# Patient Record
Sex: Female | Born: 1960 | Race: Black or African American | Hispanic: No | Smoking: Never smoker
Health system: Southern US, Community
[De-identification: ages and names within clinical notes are randomized; demographics above are authoritative.]

## PROBLEM LIST (undated history)

## (undated) ENCOUNTER — Emergency Department: Discharge status: Absent without leave | Payer: Medicaid Other

## (undated) DIAGNOSIS — E119 Type 2 diabetes mellitus without complications: Secondary | ICD-10-CM

---

## 2015-07-11 ENCOUNTER — Encounter (HOSPITAL_COMMUNITY): Payer: Self-pay | Admitting: Internal Medicine

## 2015-07-11 ENCOUNTER — Observation Stay (HOSPITAL_COMMUNITY)
Admission: AD | Admit: 2015-07-11 | Discharge: 2015-07-13 | Disposition: A | Discharge status: Home / Self Care | Payer: Medicaid Other | Source: Other Acute Inpatient Hospital | Attending: Internal Medicine | Admitting: Internal Medicine

## 2015-07-11 DIAGNOSIS — R51 Headache: Secondary | ICD-10-CM | POA: Diagnosis not present

## 2015-07-11 DIAGNOSIS — R0602 Shortness of breath: Secondary | ICD-10-CM | POA: Insufficient documentation

## 2015-07-11 DIAGNOSIS — E119 Type 2 diabetes mellitus without complications: Secondary | ICD-10-CM | POA: Diagnosis not present

## 2015-07-11 DIAGNOSIS — R001 Bradycardia, unspecified: Secondary | ICD-10-CM | POA: Diagnosis not present

## 2015-07-11 DIAGNOSIS — R519 Headache, unspecified: Secondary | ICD-10-CM | POA: Diagnosis present

## 2015-07-11 DIAGNOSIS — R262 Difficulty in walking, not elsewhere classified: Secondary | ICD-10-CM | POA: Diagnosis not present

## 2015-07-11 DIAGNOSIS — R079 Chest pain, unspecified: Secondary | ICD-10-CM | POA: Diagnosis not present

## 2015-07-11 HISTORY — DX: Type 2 diabetes mellitus without complications: E11.9

## 2015-07-11 LAB — GLUCOSE, CAPILLARY: Glucose-Capillary: 183 mg/dL — ABNORMAL HIGH (ref 65–99)

## 2015-07-11 MED ORDER — SODIUM CHLORIDE 0.9 % IV SOLN
INTRAVENOUS | Status: DC
  Administered 2015-07-12: 02:00:00 via INTRAVENOUS

## 2015-07-11 MED ORDER — SENNOSIDES-DOCUSATE SODIUM 8.6-50 MG PO TABS: 1.0000 | ORAL_TABLET | Freq: Every evening | ORAL | Status: DC | PRN

## 2015-07-11 MED ORDER — INSULIN ASPART 100 UNIT/ML ~~LOC~~ SOLN
0.0000 [IU] | Freq: Three times a day (TID) | SUBCUTANEOUS | Status: DC
  Administered 2015-07-12 (×2): 3 [IU] via SUBCUTANEOUS
  Administered 2015-07-12: 5 [IU] via SUBCUTANEOUS
  Administered 2015-07-13: 1 [IU] via SUBCUTANEOUS
  Administered 2015-07-13: 5 [IU] via SUBCUTANEOUS

## 2015-07-11 NOTE — Progress Notes (Signed)
Pt arrived at 2145 from [facility]. Pt alert and oriented x2. C/O of pain 10/10. Call light within reach. Will continue to monitor.

## 2015-07-11 NOTE — H&P (Addendum)
[facility] History and Physical  Tambra Muller ZOX:[id number] DOB: 10-Dec-1960 DOA: 07/11/2015  Referring physician: Patient was transferred from [facility]. PCP: No primary care provider on file.  Specialists: None.  Chief Complaint: Chest pain and headache.  HPI: Cathy Farley is a 54 y.o. female with history of diabetes mellitus type 2 on Lantus and metformin presented to the ER at [facility] with complaints of chest pain. However that patient also is complaining of generalized body ache and also frontal headache. Patient has been having these symptoms for last 2 days. Denies any fever or chills. Denies any new medications. Patient states she has been feeling weak for last few days and also had a fall one week ago. Patient EKG was showing bad sinus bradycardia with normal troponin. As per the labs reviewed from the other hospital most of them were unremarkable. 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 On exam patient is mildly drowsy and has difficulty walking. Patient also complains of left lower extremity pain. Patient denies any incontinence of urine or bowels. Patient chest pain is mostly in the left anterior chest wall stabbing in nature nonradiating present even at rest. Patient also has mild shortness of breath. Denies any productive cough.   Review of Systems: As presented in the history of presenting illness, rest negative.  Past Medical History  Diagnosis Date  . Diabetes mellitus without complication    History reviewed. No pertinent past surgical history. Social History:  reports that she has never smoked. She does not have any smokeless tobacco history on file. She reports that she does not drink alcohol. Her drug history is not on file. Where does patient  live home. Lives with her father. Can patient participate in ADLs? Yes.  Not on File  Family History:  Family History  Problem Relation Age of Onset  . Diabetes Mellitus II Father       Prior to Admission medications   Not on File    Physical Exam: Filed Vitals:   07/11/15 2145  BP: 114/80  Pulse: 53  Temp: 97.7 F (36.5 C)  TempSrc: Oral  Resp: 20  Height: 5\' 5"  (1.651 m)  Weight: 49.714 kg (109 lb 9.6 oz)  SpO2: 2%     General:  Moderately built and poorly nourished.  Eyes: Anicteric no pallor.  ENT: No discharge from the ears eyes nose and mouth.  Neck: No mass felt.  Cardiovascular: S1 and S2 heard.  Respiratory: No rhonchi or crepitations.  Abdomen: Soft nontender bowel sounds present.  Skin: No rash.  Musculoskeletal: No edema.  Psychiatric: Patient is mildly drowsy.  Neurologic: Mildly drowsy but answering questions appropriately and is oriented to time place and person. Moves all extremities. Patient finds it weak to walk. Perla positive. Tongue is midline.  Labs on Admission:  Basic Metabolic Panel: No results for input(s): NA, K, CL, CO2, GLUCOSE, BUN, CREATININE, CALCIUM, MG, PHOS in the last 168 hours. Liver Function Tests: No results for input(s): AST, ALT, ALKPHOS, BILITOT, PROT, ALBUMIN in the last 168 hours. No results for input(s): LIPASE, AMYLASE in the last 168 hours. No results for input(s): AMMONIA in the last 168 hours. CBC: No results for input(s): WBC, NEUTROABS, HGB, HCT, MCV, PLT in the last 168 hours. Cardiac Enzymes: No results for input(s): CKTOTAL, CKMB, CKMBINDEX, TROPONINI in the last  168 hours.  BNP (last 3 results) No results for input(s): BNP in the last 8760 hours.  ProBNP (last 3 results) No results for input(s): PROBNP in the last 8760 hours.  CBG: No results for input(s): GLUCAP in the last 168 hours.  Radiological Exams on Admission: No results found.  EKG: Independently reviewed. Sinus bradycardia  with a beats around 53 bpm. This was an EKG done at [facility].  Assessment/Plan Principal Problem:   Chest pain Active Problems:   Headache   Diabetes mellitus type 2, controlled   1. Chest pain - has atypical symptoms but given history of diabetes at this time we will cycle cardiac markers check 2-D echo. Check CT angiogram of the chest as patient is also complaining of shortness of breath. 2. Headache - with CAT scan at [facility] showing an last ventricles (I have not seen the official report) I have discussed with on-call neurologist Dr. Amada Jupiter. Dr. Amada Jupiter has advised to get MRI brain and to reconsult neurologist after the MRI brain. Check sedimentation rate. 3. Diabetes mellitus type 2 - patient is on Lantus regimen. Continue with close monitoring of CBGs. Hold metformin for now. 4. Sinus bradycardia - repeat EKG has been ordered. Closely monitor in telemetry. Check TSH. Drug screens were negative at [facility].  I have reviewed patient's charts from [facility]. I have discussed with on-call neurologist Dr. Amada Jupiter. Patient's home medications have yet not been entered in the list. I have requested pharmacy to enter.   DVT Prophylaxis SCDs. Code Status: Full code.  Family Communication: Discussed with patient.  Disposition Plan: Admit to inpatient.    Jordon Bourquin N. [facility] Pager [phone].  If 7PM-7AM, please contact night-coverage [email or web address] Password [facility] 07/11/2015, 11:33 PM

## 2015-07-12 ENCOUNTER — Inpatient Hospital Stay (HOSPITAL_COMMUNITY): Payer: Medicaid Other

## 2015-07-12 ENCOUNTER — Inpatient Hospital Stay (HOSPITAL_BASED_OUTPATIENT_CLINIC_OR_DEPARTMENT_OTHER): Payer: Medicaid Other

## 2015-07-12 ENCOUNTER — Encounter (HOSPITAL_COMMUNITY): Payer: Self-pay | Admitting: Radiology

## 2015-07-12 DIAGNOSIS — R079 Chest pain, unspecified: Secondary | ICD-10-CM

## 2015-07-12 DIAGNOSIS — R51 Headache: Secondary | ICD-10-CM | POA: Diagnosis not present

## 2015-07-12 DIAGNOSIS — R262 Difficulty in walking, not elsewhere classified: Secondary | ICD-10-CM | POA: Diagnosis not present

## 2015-07-12 DIAGNOSIS — E119 Type 2 diabetes mellitus without complications: Secondary | ICD-10-CM | POA: Diagnosis not present

## 2015-07-12 LAB — COMPREHENSIVE METABOLIC PANEL
ALT: 13 U/L — ABNORMAL LOW (ref 14–54)
ANION GAP: 7 (ref 5–15)
AST: 18 U/L (ref 15–41)
Albumin: 3.6 g/dL (ref 3.5–5.0)
Alkaline Phosphatase: 64 U/L (ref 38–126)
BUN: 10 mg/dL (ref 6–20)
CO2: 24 mmol/L (ref 22–32)
CREATININE: 0.59 mg/dL (ref 0.44–1.00)
Calcium: 8.9 mg/dL (ref 8.9–10.3)
Chloride: 105 mmol/L (ref 101–111)
GFR calc Af Amer: >60 mL/min (ref >60)
GFR calc non Af Amer: >60 mL/min (ref >60)
GLUCOSE: 204 mg/dL — AB (ref 65–99)
POTASSIUM: 3.5 mmol/L (ref 3.5–5.1)
Sodium: 136 mmol/L (ref 135–145)
Total Bilirubin: 0.7 mg/dL (ref 0.3–1.2)
Total Protein: 6.6 g/dL (ref 6.5–8.1)

## 2015-07-12 LAB — SEDIMENTATION RATE: Sed Rate: 27 mm/hr — ABNORMAL HIGH (ref 0–22)

## 2015-07-12 LAB — TROPONIN I
Troponin I: <0.03 ng/mL (ref <0.031)
Troponin I: <0.03 ng/mL (ref <0.031)

## 2015-07-12 LAB — GLUCOSE, CAPILLARY
GLUCOSE-CAPILLARY: 491 mg/dL — AB (ref 65–99)
GLUCOSE-CAPILLARY: 245 mg/dL — AB (ref 65–99)
Glucose-Capillary: 208 mg/dL — ABNORMAL HIGH (ref 65–99)
Glucose-Capillary: 375 mg/dL — ABNORMAL HIGH (ref 65–99)

## 2015-07-12 LAB — TSH: TSH: 1.643 u[IU]/mL (ref 0.350–4.500)

## 2015-07-12 LAB — AMMONIA: Ammonia: 25 umol/L (ref 9–35)

## 2015-07-12 MED ORDER — INSULIN GLARGINE 100 UNIT/ML ~~LOC~~ SOLN
20.0000 [IU] | Freq: Every day | SUBCUTANEOUS | Status: DC
  Administered 2015-07-12: 20 [IU] via SUBCUTANEOUS
  Filled 2015-07-12 (×2): qty 0.2

## 2015-07-12 MED ORDER — TRAMADOL HCL 50 MG PO TABS: 50.0000 mg | ORAL_TABLET | Freq: Four times a day (QID) | ORAL | Status: DC | PRN

## 2015-07-12 MED ORDER — ACETAMINOPHEN 325 MG PO TABS
650.0000 mg | ORAL_TABLET | Freq: Four times a day (QID) | ORAL | Status: DC | PRN
  Administered 2015-07-12: 650 mg via ORAL
  Filled 2015-07-12: qty 2

## 2015-07-12 MED ORDER — IOHEXOL 350 MG/ML SOLN
100.0000 mL | Freq: Once | INTRAVENOUS | Status: AC | PRN
  Administered 2015-07-12: 100 mL via INTRAVENOUS

## 2015-07-12 NOTE — Evaluation (Signed)
Physical Therapy Evaluation Patient Details Name: Cathy Farley MRN: [id number] DOB: 08/20/1961 Today's Date: 07/12/2015   History of Present Illness  54 y.o. female with history of diabetes mellitus type 2 on Lantus and metformin presented to the ER at [facility] with complaints of chest pain. However that patient also is complaining of generalized body ache and also frontal headache.  Clinical Impression  Patient demonstrates deficits in functional mobility as indicated below. Will need continued skilled PT to address deficits and maximize function. Will see as indicated and progress as tolerated. At this time, unsure of patient true baseline, patient very anxious and self limiting during session. As patient began to relax and follow cues for gait, patient was able to normalize gait and walk across the room with min guard assist.     Follow Up Recommendations Home health PT;Supervision/Assistance - 24 hour    Equipment Recommendations  Rolling walker with 5" wheels    Recommendations for Other Services       Precautions / Restrictions Precautions Precautions: Fall Restrictions Weight Bearing Restrictions: No      Mobility  Bed Mobility Overal bed mobility: Needs Assistance Bed Mobility: Supine to Sit;Sit to Supine     Supine to sit: Supervision Sit to supine: Min guard   General bed mobility comments: Increased time to perform, MAX cues for sequencing and problem solving to come to EOB, VCs for positioning to elevate LEs back to bed. No physical assist provided  Transfers Overall transfer level: Needs assistance Equipment used: 1 person hand held assist Transfers: Sit to/from Stand Sit to Stand: Min assist         General transfer comment: min assist for stablity and encouragement, patient reports pain in BLEs but could not specify where.   Ambulation/Gait Ambulation/Gait assistance: Min assist Ambulation Distance (Feet): 40 Feet Assistive device: 1 person  hand held assist (with gait belt) Gait Pattern/deviations: Step-to pattern;Step-through pattern;Decreased stride length;Drifts right/left;Narrow base of support Gait velocity: decreased Gait velocity interpretation: <1.8 ft/sec, indicative of risk for recurrent falls General Gait Details: Patient initially reluctant to perform ambulation stating that she could not walk because her legs are "too weak to hold her up". Pointed out to patient that she was already standing patient seem perplexed but then agreeable to ambulate. Patient initally taking very small uncoordiated steps with shuffling gait and no step through advancement. Encouraged patient to take larger stride with manual assist and cues to faciliatate. Patient then able to ambulate with increase stride and ease of gait.   Stairs            Wheelchair Mobility    Modified Rankin (Stroke Patients Only)       Balance Overall balance assessment: Needs assistance   Sitting balance-Leahy Scale: Good     Standing balance support: Single extremity supported;During functional activity Standing balance-Leahy Scale: Poor Standing balance comment: patient able to stand but becomes very anxious and requires increased assist and cues for calming                             Pertinent Vitals/Pain Pain Assessment: reports pain, no value, no facial expression of pain, patient generalizes pain to bilateral LEs but can not precisely state where.    Home Living Family/patient expects to be discharged to:: Private residence Living Arrangements: Children Available Help at Discharge: Available 24 hours/day Type of Home: Apartment Home Access: Level entry     Home Layout: One level  Home Equipment: None Additional Comments: question patient reliabiliaty as historian, some inconsistencies with PLOF     Prior Function Level of Independence: Independent         Comments: question patient reliabiliaty as historian, some  inconsistencies with PLOF      Hand Dominance   Dominant Hand: Right    Extremity/Trunk Assessment   Upper Extremity Assessment: Generalized weakness           Lower Extremity Assessment: Generalized weakness (symetrical strength, reports some neuropathy in BLEs)      Cervical / Trunk Assessment: Kyphotic  Communication      Cognition Arousal/Alertness: Awake/alert Behavior During Therapy: Anxious Overall Cognitive Status: Impaired/Different from baseline Area of Impairment: Attention;Memory;Following commands;Safety/judgement;Awareness;Problem solving     Memory: Decreased short-term memory Following Commands: Follows one step commands consistently;Follows one step commands with increased time Safety/Judgement: Decreased awareness of safety;Decreased awareness of deficits Awareness: Intellectual Problem Solving: Slow processing;Decreased initiation;Difficulty sequencing;Requires verbal cues;Requires tactile cues General Comments: patient appears confused intermittently throughout session    General Comments      Exercises        Assessment/Plan    PT Assessment Patient needs continued PT services  PT Diagnosis Difficulty walking;Abnormality of gait;Generalized weakness;Altered mental status   PT Problem List Decreased strength;Decreased range of motion;Decreased activity tolerance;Decreased balance;Decreased mobility;Decreased coordination;Decreased cognition;Decreased knowledge of use of DME;Decreased safety awareness  PT Treatment Interventions DME instruction;Gait training;Functional mobility training;Therapeutic activities;Therapeutic exercise;Balance training;Cognitive remediation;Patient/family education   PT Goals (Current goals can be found in the Care Plan section) Acute Rehab PT Goals Patient Stated Goal: to go home to her daughters PT Goal Formulation: With patient Time For Goal Achievement: 07/26/15 Potential to Achieve Goals: Fair    Frequency  Min 3X/week   Barriers to discharge        Co-evaluation               End of Session Equipment Utilized During Treatment: Gait belt Activity Tolerance: Patient limited by fatigue Patient left: in bed;with call bell/phone within reach;with bed alarm set Nurse Communication: Mobility status         Time: 6213-0865 PT Time Calculation (min) (ACUTE ONLY): 18 min   Charges:   PT Evaluation $Initial PT Evaluation Tier I: 1 Procedure     PT G Codes:   PT G-Codes **NOT FOR INPATIENT CLASS** Functional Assessment Tool Used: clincal judgement Functional Limitation: Mobility: Walking and moving around Mobility: Walking and Moving Around Current Status (H8469): 0 percent impaired, limited or restricted Mobility: Walking and Moving Around Goal Status (G2952): At least 20 percent but less than 40 percent impaired, limited or restricted    Fabio Asa 07/12/2015, 5:56 PM Charlotte Crumb, PT DPT  [phone]

## 2015-07-12 NOTE — Progress Notes (Signed)
Echocardiogram 2D Echocardiogram has been performed.  Cathy Farley 07/12/2015, 11:03 AM

## 2015-07-12 NOTE — Progress Notes (Signed)
PROGRESS NOTE  Cathy Farley ZOX:[id number] DOB: 1961-01-19 DOA: 07/11/2015 PCP: No primary care provider on file.  Assessment/Plan: Chest pain- CE negative CTA negative Echo pending -chest x ray  Headache -resolved -MRI negative  DM -SSI -lantus -HgbA1C pending  Sinus brady -monitor  Home O2 eval   Code Status: full Family Communication: patient Disposition Plan:    Consultants:        HPI/Subjective: Still having some CP  Objective: Filed Vitals:   07/12/15 1026  BP: 119/71  Pulse: 64  Temp: 99.1 F (37.3 C)  Resp: 20   No intake or output data in the 24 hours ending 07/12/15 1159 Filed Weights   07/11/15 2145  Weight: 49.714 kg (109 lb 9.6 oz)    Exam:   General:  NAD, slow  Cardiovascular: rrr  Respiratory: clear  Abdomen: +BS,soft  Musculoskeletal: no edema   Data Reviewed: Basic Metabolic Panel:  Recent Labs Lab 07/12/15 0039  NA 136  K 3.5  CL 105  CO2 24  GLUCOSE 204*  BUN 10  CREATININE 0.59  CALCIUM 8.9   Liver Function Tests:  Recent Labs Lab 07/12/15 0039  AST 18  ALT 13*  ALKPHOS 64  BILITOT 0.7  PROT 6.6  ALBUMIN 3.6   No results for input(s): LIPASE, AMYLASE in the last 168 hours.  Recent Labs Lab 07/12/15 0039  AMMONIA 25   CBC: No results for input(s): WBC, NEUTROABS, HGB, HCT, MCV, PLT in the last 168 hours. Cardiac Enzymes:  Recent Labs Lab 07/12/15 0039 07/12/15 0600  TROPONINI <0.03 <0.03   BNP (last 3 results) No results for input(s): BNP in the last 8760 hours.  ProBNP (last 3 results) No results for input(s): PROBNP in the last 8760 hours.  CBG:  Recent Labs Lab 07/11/15 2344 07/12/15 0635 07/12/15 1140  GLUCAP 183* 491* 245*    No results found for this or any previous visit (from the past 240 hour(s)).   Studies: Ct Angio Chest Pe W/cm &/or Wo Cm  07/12/2015   CLINICAL DATA:  Acute onset of shortness of breath and generalized chest pain. Initial encounter.   EXAM: CT ANGIOGRAPHY CHEST WITH CONTRAST  TECHNIQUE: Multidetector CT imaging of the chest was performed using the standard protocol during bolus administration of intravenous contrast. Multiplanar CT image reconstructions and MIPs were obtained to evaluate the vascular anatomy.  CONTRAST:  OMNIPAQUE IOHEXOL 350 MG/ML SOLN  COMPARISON:  None.  FINDINGS: There is no evidence of pulmonary embolus.  Minimal bibasilar atelectasis is noted. The lungs are otherwise clear. There is no evidence of significant focal consolidation, pleural effusion or pneumothorax. No masses are identified; no abnormal focal contrast enhancement is seen.  The mediastinum is unremarkable in appearance. No mediastinal lymphadenopathy is seen. No pericardial effusion is identified. The great vessels are grossly unremarkable in appearance. Incidental note is made of a direct origin of the left vertebral artery from the aortic arch. No axillary lymphadenopathy is seen. The visualized portions of the thyroid gland are unremarkable in appearance.  The visualized portions of the liver and spleen are unremarkable. There is mild reflux of contrast into the IVC.  No acute osseous abnormalities are seen.  Review of the MIP images confirms the above findings.  IMPRESSION: 1. No evidence of pulmonary embolus. 2. Minimal bibasilar atelectasis noted; lungs otherwise clear.   Electronically Signed   By: Roanna Raider M.D.   On: 07/12/2015 03:45   Mr Brain Wo Contrast  07/12/2015   CLINICAL DATA:  Initial evaluation for several days history of dizziness and headaches.  EXAM: MRI HEAD WITHOUT CONTRAST  TECHNIQUE: Multiplanar, multiecho pulse sequences of the brain and surrounding structures were obtained without intravenous contrast.  COMPARISON:  None.  FINDINGS: The CSF containing spaces are within normal limits for patient age. No focal parenchymal signal abnormality is identified. No mass lesion, midline shift, or extra-axial fluid collection.  Ventricles are mildly prominent for patient age, but felt to be within normal limits.  No diffusion-weighted signal abnormality is identified to suggest acute intracranial infarct. Gray-white matter differentiation is maintained. Normal flow voids are seen within the intracranial vasculature. No intracranial hemorrhage identified.  The cervicomedullary junction is normal. Pituitary gland is within normal limits. Pituitary stalk is midline. The globes and optic nerves demonstrate a normal appearance with normal signal intensity.  The bone marrow signal intensity is normal. Calvarium is intact. Visualized upper cervical spine is within normal limits.  Scalp soft tissues are unremarkable.  Paranasal sinuses are clear.  No mastoid effusion.  Bone marrow signal intensity within normal limits. No scalp soft tissue abnormality.  IMPRESSION: Normal brain MRI with no acute intracranial process identified.   Electronically Signed   By: Rise Mu M.D.   On: 07/12/2015 06:57    Scheduled Meds: . insulin aspart  0-9 Units Subcutaneous TID WC  . insulin glargine  20 Units Subcutaneous Daily   Continuous Infusions:  Antibiotics Given (last 72 hours)    None      Principal Problem:   Chest pain Active Problems:   Headache   Diabetes mellitus type 2, controlled    Time spent: 25 min    Maegan Buller  [facility] Pager [phone] If 7PM-7AM, please contact night-coverage at [email or web address], password [facility] 07/12/2015, 11:59 AM  LOS: 1 day

## 2015-07-12 NOTE — Progress Notes (Signed)
Pt blood sugar was 491. Pt alert and oriented. No s/s of acute distress. MD notified with one time order. Pharmacy was notified of stat insulin.  Reported off to oncoming nurse.   Shella Spearing, RN

## 2015-07-13 DIAGNOSIS — R079 Chest pain, unspecified: Secondary | ICD-10-CM | POA: Diagnosis not present

## 2015-07-13 DIAGNOSIS — R51 Headache: Secondary | ICD-10-CM | POA: Diagnosis not present

## 2015-07-13 DIAGNOSIS — E119 Type 2 diabetes mellitus without complications: Secondary | ICD-10-CM | POA: Diagnosis not present

## 2015-07-13 LAB — HEMOGLOBIN A1C
Hgb A1c MFr Bld: 8.6 % — ABNORMAL HIGH (ref 4.8–5.6)
Mean Plasma Glucose: 200 mg/dL

## 2015-07-13 LAB — GLUCOSE, CAPILLARY
GLUCOSE-CAPILLARY: 141 mg/dL — AB (ref 65–99)
Glucose-Capillary: 289 mg/dL — ABNORMAL HIGH (ref 65–99)
Glucose-Capillary: 174 mg/dL — ABNORMAL HIGH (ref 65–99)

## 2015-07-13 MED ORDER — INSULIN GLARGINE 100 UNIT/ML ~~LOC~~ SOLN
23.0000 [IU] | Freq: Every day | SUBCUTANEOUS | Status: DC
  Administered 2015-07-13: 23 [IU] via SUBCUTANEOUS
  Filled 2015-07-13: qty 0.23

## 2015-07-13 MED ORDER — INSULIN GLARGINE 100 UNIT/ML SOLOSTAR PEN: 23.0000 [IU] | PEN_INJECTOR | Freq: Every day | SUBCUTANEOUS | Status: AC

## 2015-07-13 NOTE — Progress Notes (Signed)
  RD consulted for nutrition education regarding diabetes.   Lab Results  Component Value Date   HGBA1C 8.6* 07/12/2015    RD provided "Type 2 Diabetes Nutrition Therapy" handout from the Academy of [employer]. Discussed different food groups and their effects on blood sugar, emphasizing carbohydrate-containing foods. Provided list of carbohydrates and recommended serving sizes of common foods.  Discussed importance of controlled and consistent carbohydrate intake throughout the day. Provided examples of ways to balance meals/snacks and encouraged intake of high-fiber, whole grain complex carbohydrates. Teach back method used. With repetition, pt was eventually able to report back that she should aim for 3-5 servings of carbohydrate per meal and she was able to identify the food sources of carbohydrates on her meal tray. She states that she will stop drinking regular soda and Sunny-D and will start drinking diet beverages instead.   Expect good compliance.  Body mass index is 18.24 kg/(m^2). Pt meets criteria for Underweight based on current BMI.  Current diet order is Carb Modified, patient is consuming approximately 75% of meals at this time. Labs and medications reviewed. No further nutrition interventions warranted at this time. RD contact information provided. Encouraged pt to have her daughter contact RD with any questions. If additional nutrition issues arise, please re-consult RD.  Dorothea Ogle RD, LDN Inpatient Clinical Dietitian Pager: [phone] After Hours Pager: [phone]

## 2015-07-13 NOTE — Discharge Summary (Signed)
Physician Discharge Summary  Cathy Farley ZOX:[id number] DOB: Jun 26, 1961 DOA: 07/11/2015  PCP: No primary care provider on file.-- follows at [facility]  Admit date: 07/11/2015 Discharge date: 07/13/2015  Time spent: 35 minutes  Recommendations for Outpatient Follow-up:  1. Consider outpatient stress test 2. Home health RN 3. 24 hour supervision  Discharge Diagnoses:  Principal Problem:   Chest pain Active Problems:   Headache   Diabetes mellitus type 2, controlled   Discharge Condition: improved  Diet recommendation:  Cardiac/diabetic  Filed Weights   07/11/15 2145  Weight: 49.714 kg (109 lb 9.6 oz)    History of present illness:  Cathy Farley is a 54 y.o. female with history of diabetes mellitus type 2 on Lantus and metformin presented to the ER at [facility] with complaints of chest pain. However that patient also is complaining of generalized body ache and also frontal headache. Patient has been having these symptoms for last 2 days. Denies any fever or chills. Denies any new medications. Patient states she has been feeling weak for last few days and also had a fall one week ago. Patient EKG was showing bad sinus bradycardia with normal troponin. As per the labs reviewed from the other hospital most of them were unremarkable. 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 On exam patient is mildly drowsy and has difficulty walking. Patient also complains of left lower extremity pain. Patient denies any incontinence of urine or bowels. Patient chest pain is mostly in the left anterior chest wall stabbing in nature nonradiating present even at rest. Patient also has mild shortness of breath. Denies any productive cough  Hospital Course:  Chest pain- CE negative CTA negative Echo  ok -chest x ray ok resolved  Headache -resolved -MRI negative  DM -SSI -lantus-increase Arrange home health   Home health RN for diabetic teaching  Procedures:    Consultations:  Diabetic education  Discharge Exam: Filed Vitals:   07/13/15 0537  BP: 116/57  Pulse: 57  Temp: 97.6 F (36.4 C)  Resp: 16    General: pleasant/cooperative, NAD-symptoms resolved   Discharge Instructions   Discharge Instructions    Diet Carb Modified    Complete by:  As directed      Discharge instructions    Complete by:  As directed   Home health RN 24 hour supervision     Increase activity slowly    Complete by:  As directed           Current Discharge Medication List    CONTINUE these medications which have CHANGED   Details  Insulin Glargine (LANTUS SOLOSTAR) 100 UNIT/ML Solostar Pen Inject 23 Units into the skin at bedtime. Qty: 15 mL, Refills: 11      STOP taking these medications     metFORMIN (GLUCOPHAGE) 500 MG tablet        No Known Allergies    The results of significant diagnostics from this hospitalization (including imaging, microbiology, ancillary and laboratory) are listed below for reference.    Significant Diagnostic Studies: Ct Angio Chest Pe W/cm &/or Wo Cm  07/12/2015   CLINICAL DATA:  Acute onset of shortness of breath and generalized chest pain. Initial encounter.  EXAM: CT ANGIOGRAPHY CHEST WITH CONTRAST  TECHNIQUE: Multidetector CT imaging of the chest was performed using the standard protocol during bolus administration of  intravenous contrast. Multiplanar CT image reconstructions and MIPs were obtained to evaluate the vascular anatomy.  CONTRAST:  OMNIPAQUE IOHEXOL 350 MG/ML SOLN  COMPARISON:  None.  FINDINGS: There is no evidence of pulmonary embolus.  Minimal bibasilar atelectasis is noted. The lungs are otherwise clear. There is no evidence of significant focal consolidation, pleural effusion or pneumothorax. No masses are  identified; no abnormal focal contrast enhancement is seen.  The mediastinum is unremarkable in appearance. No mediastinal lymphadenopathy is seen. No pericardial effusion is identified. The great vessels are grossly unremarkable in appearance. Incidental note is made of a direct origin of the left vertebral artery from the aortic arch. No axillary lymphadenopathy is seen. The visualized portions of the thyroid gland are unremarkable in appearance.  The visualized portions of the liver and spleen are unremarkable. There is mild reflux of contrast into the IVC.  No acute osseous abnormalities are seen.  Review of the MIP images confirms the above findings.  IMPRESSION: 1. No evidence of pulmonary embolus. 2. Minimal bibasilar atelectasis noted; lungs otherwise clear.   Electronically Signed   By: Roanna Raider M.D.   On: 07/12/2015 03:45   Mr Brain Wo Contrast  07/12/2015   CLINICAL DATA:  Initial evaluation for several days history of dizziness and headaches.  EXAM: MRI HEAD WITHOUT CONTRAST  TECHNIQUE: Multiplanar, multiecho pulse sequences of the brain and surrounding structures were obtained without intravenous contrast.  COMPARISON:  None.  FINDINGS: The CSF containing spaces are within normal limits for patient age. No focal parenchymal signal abnormality is identified. No mass lesion, midline shift, or extra-axial fluid collection. Ventricles are mildly prominent for patient age, but felt to be within normal limits.  No diffusion-weighted signal abnormality is identified to suggest acute intracranial infarct. Gray-white matter differentiation is maintained. Normal flow voids are seen within the intracranial vasculature. No intracranial hemorrhage identified.  The cervicomedullary junction is normal. Pituitary gland is within normal limits. Pituitary stalk is midline. The globes and optic nerves demonstrate a normal appearance with normal signal intensity.  The bone marrow signal intensity is normal.  Calvarium is intact. Visualized upper cervical spine is within normal limits.  Scalp soft tissues are unremarkable.  Paranasal sinuses are clear.  No mastoid effusion.  Bone marrow signal intensity within normal limits. No scalp soft tissue abnormality.  IMPRESSION: Normal brain MRI with no acute intracranial process identified.   Electronically Signed   By: Rise Mu M.D.   On: 07/12/2015 06:57   Dg Chest Port 1 View  07/12/2015   CLINICAL DATA:  54 year old female with shortness of breath for 2 days.  EXAM: PORTABLE CHEST - 1 VIEW  COMPARISON:  07/12/2015 chest CT  FINDINGS: The cardiomediastinal silhouette is unremarkable.  There is no evidence of focal airspace disease, pulmonary edema, suspicious pulmonary nodule/mass, pleural effusion, or pneumothorax. No acute bony abnormalities are identified.  IMPRESSION: No active disease.   Electronically Signed   By: Harmon Pier M.D.   On: 07/12/2015 12:20    Microbiology: No results found for this or any previous visit (from the past 240 hour(s)).   Labs: Basic Metabolic Panel:  Recent Labs Lab 07/12/15 0039  NA 136  K 3.5  CL 105  CO2 24  GLUCOSE 204*  BUN 10  CREATININE 0.59  CALCIUM 8.9   Liver Function Tests:  Recent Labs Lab 07/12/15 0039  AST 18  ALT 13*  ALKPHOS 64  BILITOT 0.7  PROT 6.6  ALBUMIN 3.6   No  results for input(s): LIPASE, AMYLASE in the last 168 hours.  Recent Labs Lab 07/12/15 0039  AMMONIA 25   CBC: No results for input(s): WBC, NEUTROABS, HGB, HCT, MCV, PLT in the last 168 hours. Cardiac Enzymes:  Recent Labs Lab 07/12/15 0039 07/12/15 0600 07/12/15 1133  TROPONINI <0.03 <0.03 <0.03   BNP: BNP (last 3 results) No results for input(s): BNP in the last 8760 hours.  ProBNP (last 3 results) No results for input(s): PROBNP in the last 8760 hours.  CBG:  Recent Labs Lab 07/12/15 0635 07/12/15 1140 07/12/15 1639 07/12/15 2157 07/13/15 0649  GLUCAP 491* 245* 208* 375* 289*        Signed:  Killian Schwer  [facility] 07/13/2015, 8:53 AM

## 2015-07-13 NOTE — Progress Notes (Signed)
Discharge instructions were given to patient.  Patient stated her daughter is on her way to pick her up. Staff called her duaghter for confirmation and she said she or somebody will be here to pick her up. Her number is [phone]

## 2015-07-13 NOTE — Care Management Note (Signed)
Case Management Note  Patient Details  Name: Cathy Farley MRN: [id number] Date of Birth: Jul 28, 1961  Subjective/Objective:                    Action/Plan: Met with patient to discuss discharge planning. Patient states that she will be discharging home with her daughter Cathy Farley.  Per patient, daughter's address is [address] in [place], [place].  Patient is unsure of her daughter's phone number, which is not listed in the chart.  Per patient, daughter will be the one picking her up for discharge today.  CM spoke with Stanton Kidney with [facility], who has accepted the referral for discharge home today.  [facility] was notified of need for rolling walker prior to discharge home today.  Bedside RN updated.  Expected Discharge Date:                  Expected Discharge Plan:  [place]  In-House Referral:     Discharge planning Services  CM Consult  Post Acute Care Choice:    Choice offered to:  Patient  DME Arranged:  Walker rolling DME Agency:  [place]:  RN [facility] Agency:  [place]  Status of Service:  Completed, signed off  Medicare Important Message Given:    Date Medicare IM Given:    Medicare IM give by:    Date Additional Medicare IM Given:    Additional Medicare Important Message give by:     If discussed at [place] of Stay Meetings, dates discussed:    Additional Comments:  Rolm Baptise, RN 07/13/2015, 10:34 AM

## 2017-04-06 IMAGING — MR MR HEAD W/O CM
9 of 10 series · 36 of 48 positions shown · non-contrast
Comparison: None.

CLINICAL DATA: Initial evaluation for several days history of
dizziness and headaches.

EXAM:
MRI HEAD WITHOUT CONTRAST
TECHNIQUE: Multiplanar, multiecho pulse sequences of the brain and surrounding
structures were obtained without intravenous contrast.

[Series 3: DWI · axial · 3.0mm · 1.09mm/px · z∈[-90,+33]mm · 8 of 84 slices shown (1 of 4)]
[im 1/84]
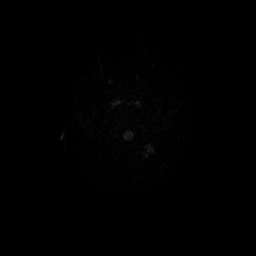
[im 12/84]
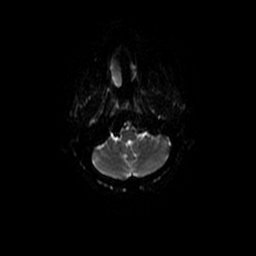
[im 24/84]
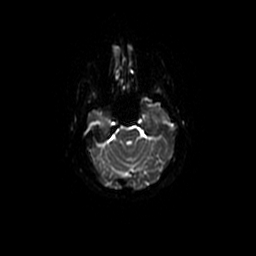
[im 36/84]
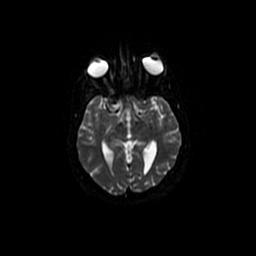
[im 48/84]
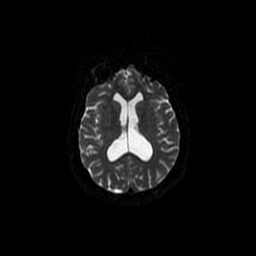
[im 60/84]
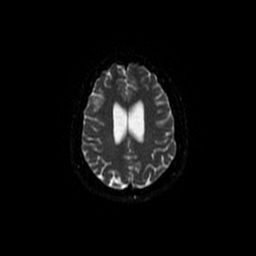
[im 72/84]
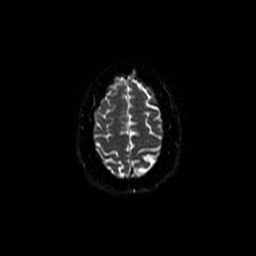
[im 84/84]
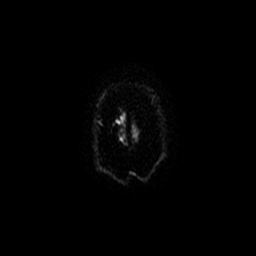

[Series 4: T1 · sagittal · 5.0mm · 0.47mm/px · 2 of 22 slices shown]
[im 1/22]
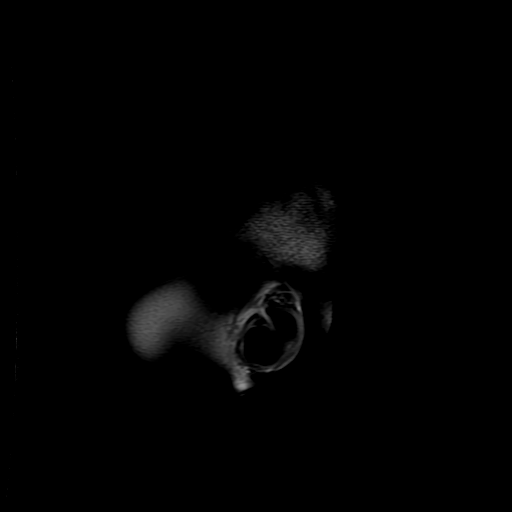
[im 22/22]
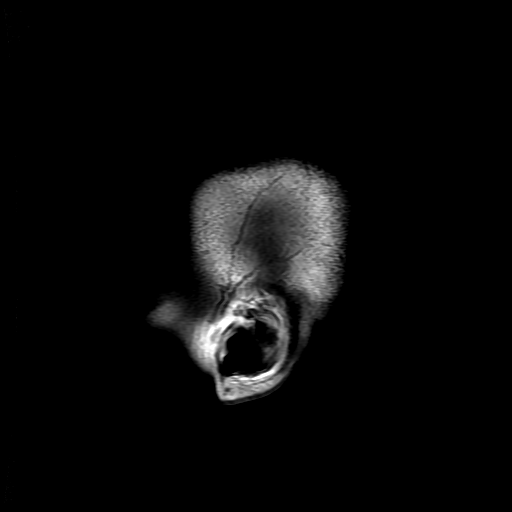

[Series 5: DWI · coronal · 5.0mm · 1.09mm/px · 7 of 64 slices shown (2 of 4)]
[im 1/64]
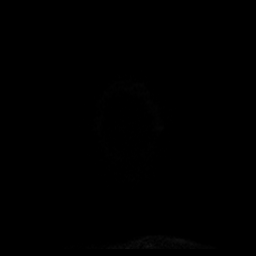
[im 11/64]
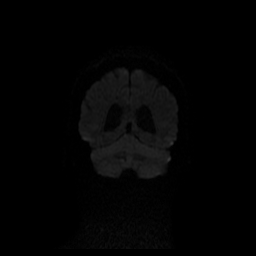
[im 22/64]
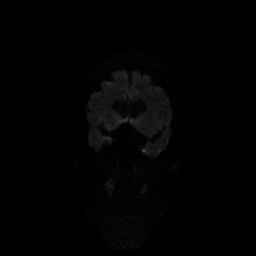
[im 32/64]
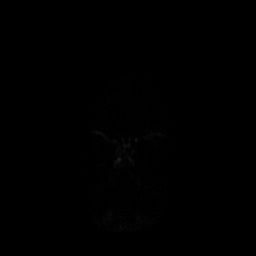
[im 43/64]
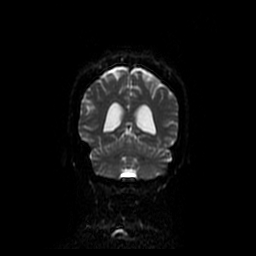
[im 53/64]
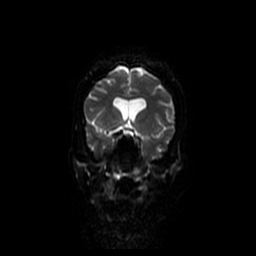
[im 64/64]
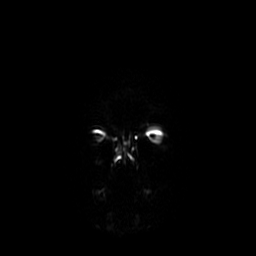

[Series 6: T2 · axial · 5.0mm · 0.43mm/px · z∈[-92,+40]mm · 3 of 23 slices shown (1 of 2)]
[im 1/23]
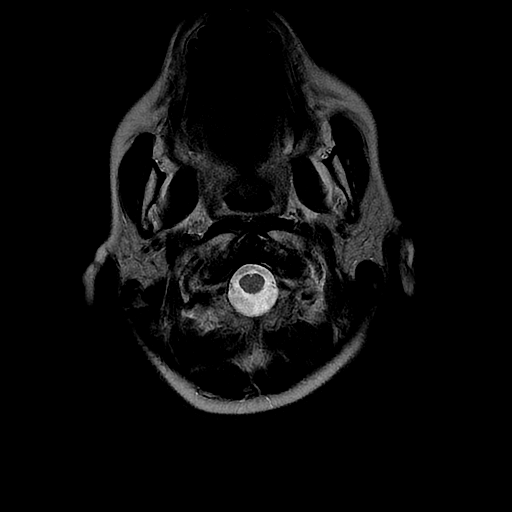
[im 12/23]
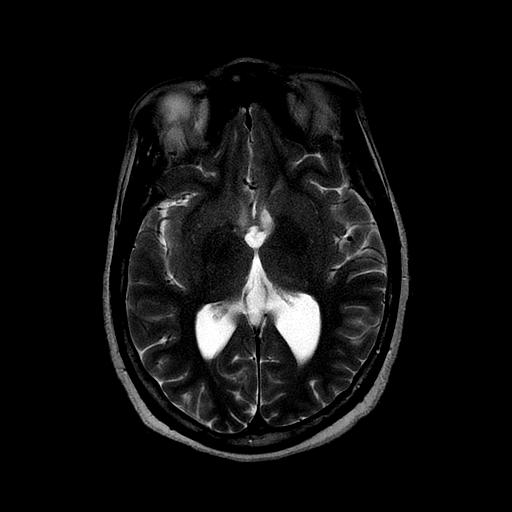
[im 23/23]
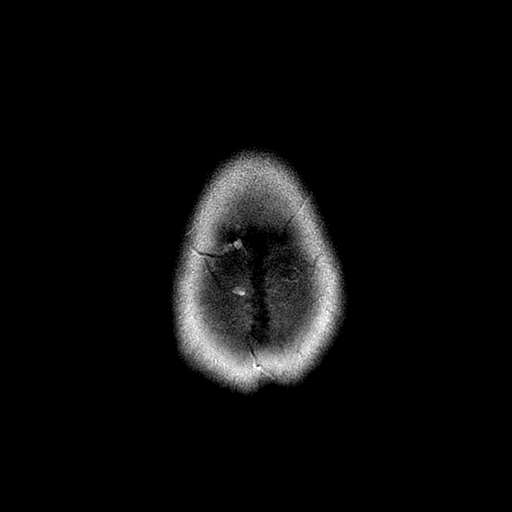

[Series 7: FLAIR · axial · 5.0mm · 0.43mm/px · z∈[-92,+40]mm · 3 of 23 slices shown]
[im 1/23]
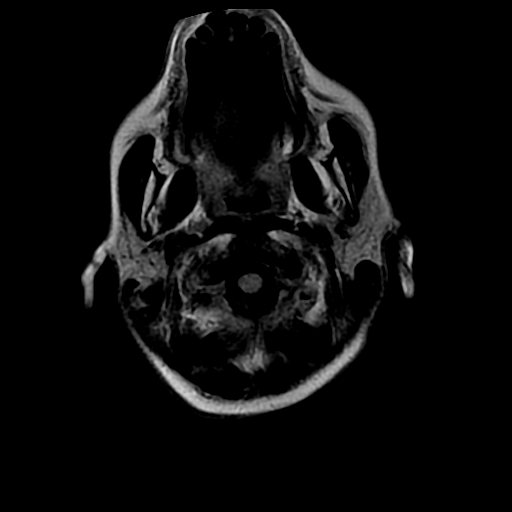
[im 12/23]
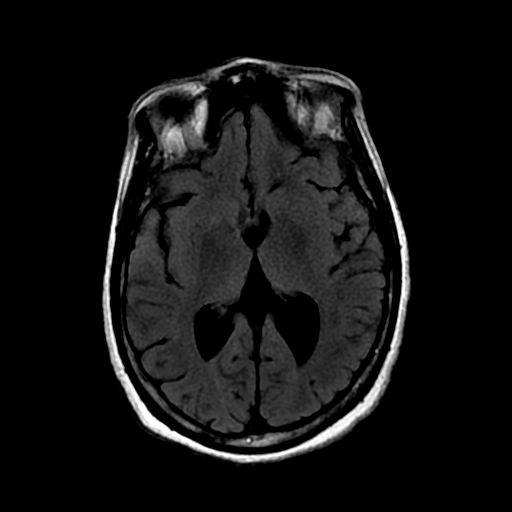
[im 23/23]
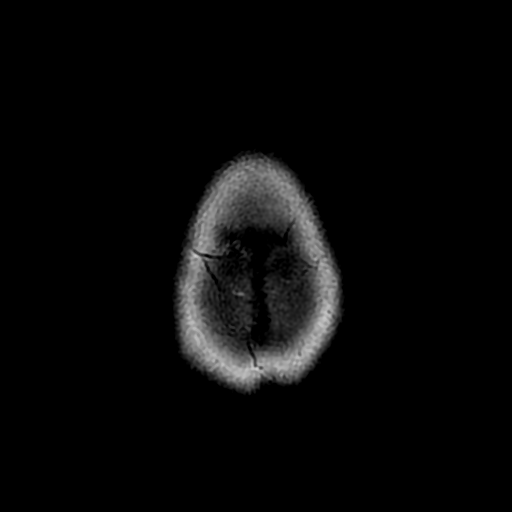

[Series 8: ax mpgr · axial · 5.0mm · 0.43mm/px · 1 of 23 slices shown]
[im 1/23]
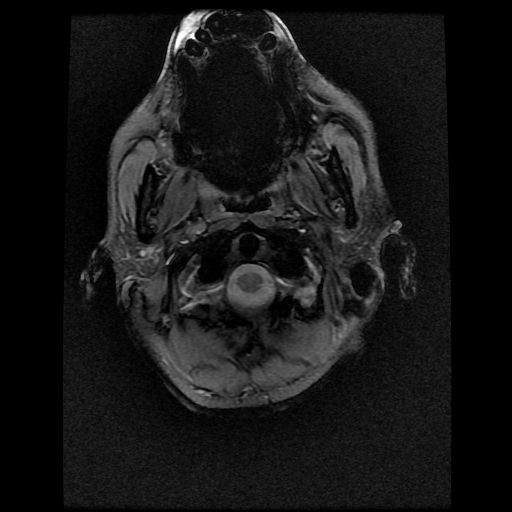

[Series 10: T2 · coronal · 5.0mm · 0.43mm/px · 3 of 26 slices shown (2 of 2)]
[im 1/26]
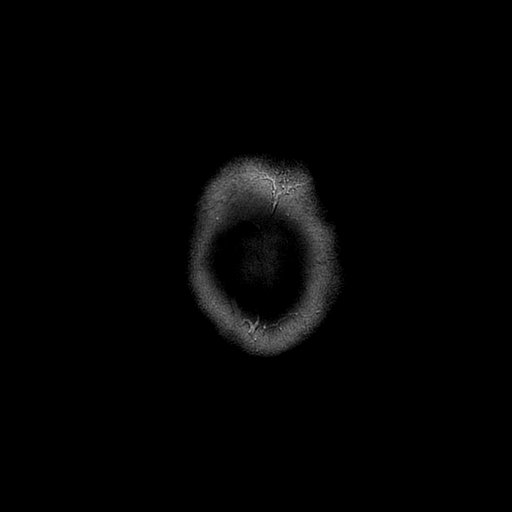
[im 13/26]
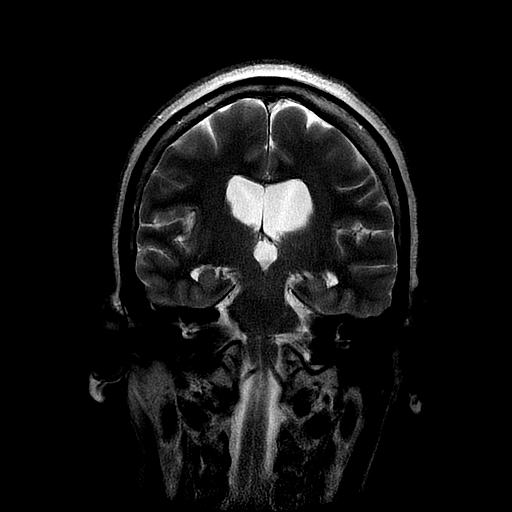
[im 26/26]
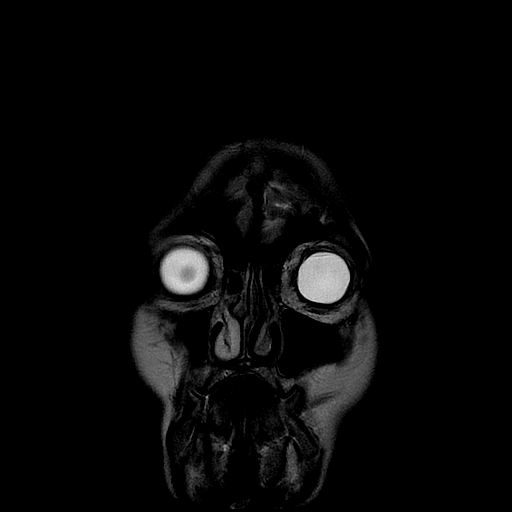

[Series 300: DWI · axial · 3.0mm · 1.09mm/px · z∈[-90,+33]mm · 5 of 42 slices shown (3 of 4)]
[im 1/42]
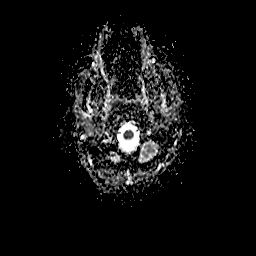
[im 11/42]
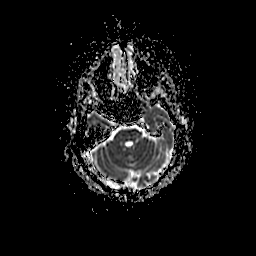
[im 21/42]
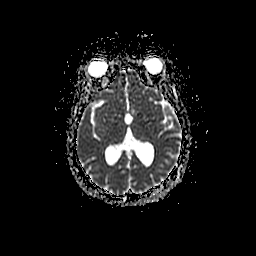
[im 31/42]
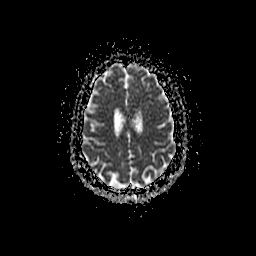
[im 42/42]
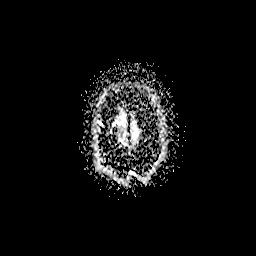

[Series 500: DWI · coronal · 5.0mm · 1.09mm/px · 4 of 32 slices shown (4 of 4)]
[im 1/32]
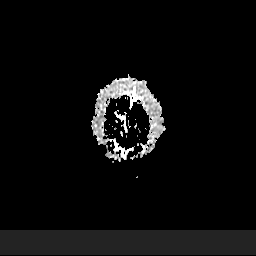
[im 11/32]
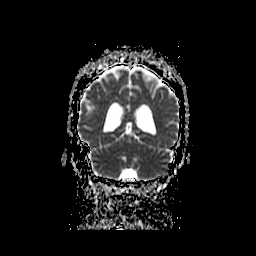
[im 21/32]
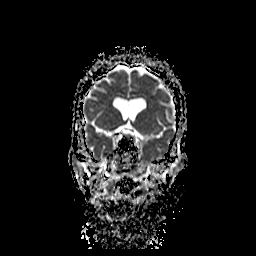
[im 32/32]
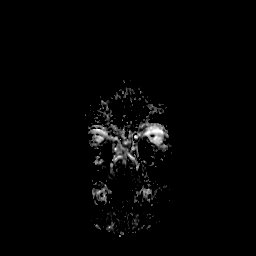

[36 of 48 positions shown; findings below may reference images not displayed]

FINDINGS: The CSF containing spaces are within normal limits for patient age.
No focal parenchymal signal abnormality is identified. No mass
lesion, midline shift, or extra-axial fluid collection. Ventricles
are mildly prominent for patient age, but felt to be within normal
limits.

No diffusion-weighted signal abnormality is identified to suggest
acute intracranial infarct. Gray-white matter differentiation is
maintained. Normal flow voids are seen within the intracranial
vasculature. No intracranial hemorrhage identified.

The cervicomedullary junction is normal. Pituitary gland is within
normal limits. Pituitary stalk is midline. The globes and optic
nerves demonstrate a normal appearance with normal signal intensity.

The bone marrow signal intensity is normal. Calvarium is intact.
Visualized upper cervical spine is within normal limits.

Scalp soft tissues are unremarkable.

Paranasal sinuses are clear.  No mastoid effusion.

Bone marrow signal intensity within normal limits. No scalp soft
tissue abnormality.
IMPRESSION: Normal brain MRI with no acute intracranial process identified.

## 2018-01-13 ENCOUNTER — Inpatient Hospital Stay
Admission: AD | Admit: 2018-01-13 | Payer: Self-pay | Source: Other Acute Inpatient Hospital | Admitting: Pulmonary Disease

## 2018-01-31 DEATH — deceased
